# Patient Record
Sex: Female | Born: 1985 | Hispanic: No | Marital: Single | Smoking: Current every day smoker
Health system: Southern US, Community
[De-identification: ages and names within clinical notes are randomized; demographics above are authoritative.]

## PROBLEM LIST (undated history)

## (undated) DIAGNOSIS — N3281 Overactive bladder: Secondary | ICD-10-CM

## (undated) DIAGNOSIS — D219 Benign neoplasm of connective and other soft tissue, unspecified: Secondary | ICD-10-CM

## (undated) DIAGNOSIS — N390 Urinary tract infection, site not specified: Secondary | ICD-10-CM

## (undated) HISTORY — DX: Overactive bladder: N32.81

## (undated) HISTORY — DX: Urinary tract infection, site not specified: N39.0

## (undated) HISTORY — DX: Benign neoplasm of connective and other soft tissue, unspecified: D21.9

---

## 2008-09-19 DIAGNOSIS — N390 Urinary tract infection, site not specified: Secondary | ICD-10-CM

## 2008-09-19 HISTORY — DX: Urinary tract infection, site not specified: N39.0

## 2009-05-28 ENCOUNTER — Ambulatory Visit: Payer: Self-pay | Admitting: Internal Medicine

## 2009-05-28 DIAGNOSIS — F17200 Nicotine dependence, unspecified, uncomplicated: Secondary | ICD-10-CM | POA: Insufficient documentation

## 2009-05-28 DIAGNOSIS — R3915 Urgency of urination: Secondary | ICD-10-CM | POA: Insufficient documentation

## 2009-05-28 DIAGNOSIS — N390 Urinary tract infection, site not specified: Secondary | ICD-10-CM

## 2009-05-29 LAB — CONVERTED CEMR LAB
ALT: 18 units/L (ref 0–35)
AST: 19 units/L (ref 0–37)
Albumin: 4.5 g/dL (ref 3.5–5.2)
Alkaline Phosphatase: 33 units/L — ABNORMAL LOW (ref 39–117)
BUN: 17 mg/dL (ref 6–23)
Basophils Absolute: 0 10*3/uL (ref 0.0–0.1)
Basophils Relative: 0.1 % (ref 0.0–3.0)
Bilirubin Urine: NEGATIVE
Bilirubin, Direct: 0.1 mg/dL (ref 0.0–0.3)
CO2: 24 meq/L (ref 19–32)
Calcium: 9.1 mg/dL (ref 8.4–10.5)
Chloride: 103 meq/L (ref 96–112)
Creatinine, Ser: 0.9 mg/dL (ref 0.4–1.2)
Eosinophils Absolute: 0.2 10*3/uL (ref 0.0–0.7)
Eosinophils Relative: 3.2 % (ref 0.0–5.0)
GFR calc non Af Amer: 80.78 mL/min (ref >60)
Glucose, Bld: 93 mg/dL (ref 70–99)
HCT: 40 % (ref 36.0–46.0)
Hemoglobin: 13.8 g/dL (ref 12.0–15.0)
Ketones, ur: NEGATIVE mg/dL
Leukocytes, UA: NEGATIVE
Lymphocytes Relative: 42.8 % (ref 12.0–46.0)
Lymphs Abs: 2.1 10*3/uL (ref 0.7–4.0)
MCHC: 34.5 g/dL (ref 30.0–36.0)
MCV: 92 fL (ref 78.0–100.0)
Monocytes Absolute: 0.3 10*3/uL (ref 0.1–1.0)
Monocytes Relative: 6.7 % (ref 3.0–12.0)
Neutro Abs: 2.3 10*3/uL (ref 1.4–7.7)
Neutrophils Relative %: 47.2 % (ref 43.0–77.0)
Nitrite: NEGATIVE
Platelets: 205 10*3/uL (ref 150.0–400.0)
Potassium: 4.4 meq/L (ref 3.5–5.1)
RBC: 4.35 M/uL (ref 3.87–5.11)
RDW: 12.1 % (ref 11.5–14.6)
Sed Rate: 10 mm/hr (ref 0–22)
Sodium: 135 meq/L (ref 135–145)
Specific Gravity, Urine: 1.025 (ref 1.000–1.030)
TSH: 1.23 microintl units/mL (ref 0.35–5.50)
Total Bilirubin: 0.9 mg/dL (ref 0.3–1.2)
Total Protein, Urine: NEGATIVE mg/dL
Total Protein: 8.1 g/dL (ref 6.0–8.3)
Urine Glucose: NEGATIVE mg/dL
Urobilinogen, UA: 0.2 (ref 0.0–1.0)
Vit D, 25-Hydroxy: 26 ng/mL — ABNORMAL LOW (ref 30–89)
Vitamin B-12: 339 pg/mL (ref 211–911)
WBC: 4.9 10*3/uL (ref 4.5–10.5)
pH: 6 (ref 5.0–8.0)

## 2009-07-14 ENCOUNTER — Telehealth: Payer: Self-pay | Admitting: Internal Medicine

## 2009-07-16 ENCOUNTER — Ambulatory Visit: Payer: Self-pay | Admitting: Internal Medicine

## 2009-07-17 ENCOUNTER — Encounter: Payer: Self-pay | Admitting: Internal Medicine

## 2009-07-17 LAB — CONVERTED CEMR LAB
Bilirubin Urine: NEGATIVE
Ketones, ur: NEGATIVE mg/dL
Leukocytes, UA: NEGATIVE
Nitrite: NEGATIVE
Specific Gravity, Urine: 1.025 (ref 1.000–1.030)
Total Protein, Urine: NEGATIVE mg/dL
Urine Glucose: NEGATIVE mg/dL
Urobilinogen, UA: 0.2 (ref 0.0–1.0)
pH: 5.5 (ref 5.0–8.0)

## 2009-09-19 DIAGNOSIS — D219 Benign neoplasm of connective and other soft tissue, unspecified: Secondary | ICD-10-CM

## 2009-09-19 DIAGNOSIS — N3281 Overactive bladder: Secondary | ICD-10-CM

## 2009-09-19 HISTORY — DX: Overactive bladder: N32.81

## 2009-09-19 HISTORY — DX: Benign neoplasm of connective and other soft tissue, unspecified: D21.9

## 2009-09-19 LAB — HM PAP SMEAR

## 2009-09-28 ENCOUNTER — Ambulatory Visit: Payer: Self-pay | Admitting: Internal Medicine

## 2009-09-28 LAB — CONVERTED CEMR LAB
Bilirubin Urine: NEGATIVE
Hemoglobin, Urine: NEGATIVE
Ketones, ur: NEGATIVE mg/dL
Nitrite: NEGATIVE
Specific Gravity, Urine: <=1.005 (ref 1.000–1.030)
Total Protein, Urine: NEGATIVE mg/dL
Urine Glucose: NEGATIVE mg/dL
Urobilinogen, UA: 0.2 (ref 0.0–1.0)
pH: 7 (ref 5.0–8.0)

## 2010-03-24 ENCOUNTER — Ambulatory Visit: Payer: Self-pay | Admitting: Internal Medicine

## 2010-03-24 DIAGNOSIS — R1032 Left lower quadrant pain: Secondary | ICD-10-CM

## 2010-03-24 DIAGNOSIS — D259 Leiomyoma of uterus, unspecified: Secondary | ICD-10-CM | POA: Insufficient documentation

## 2010-04-19 LAB — CONVERTED CEMR LAB: Pap Smear: NORMAL

## 2010-05-19 ENCOUNTER — Telehealth: Payer: Self-pay | Admitting: Internal Medicine

## 2010-05-19 ENCOUNTER — Ambulatory Visit: Payer: Self-pay | Admitting: Internal Medicine

## 2010-05-19 DIAGNOSIS — J069 Acute upper respiratory infection, unspecified: Secondary | ICD-10-CM

## 2010-06-21 ENCOUNTER — Encounter: Payer: Self-pay | Admitting: Internal Medicine

## 2010-06-25 ENCOUNTER — Telehealth: Payer: Self-pay | Admitting: Internal Medicine

## 2010-07-02 ENCOUNTER — Ambulatory Visit: Payer: Self-pay | Admitting: Internal Medicine

## 2010-09-29 ENCOUNTER — Other Ambulatory Visit: Payer: Self-pay | Admitting: Internal Medicine

## 2010-09-29 ENCOUNTER — Ambulatory Visit
Admission: RE | Admit: 2010-09-29 | Discharge: 2010-09-29 | Payer: Self-pay | Source: Home / Self Care | Attending: Internal Medicine | Admitting: Internal Medicine

## 2010-09-29 LAB — URINALYSIS
Bilirubin Urine: NEGATIVE
Hemoglobin, Urine: NEGATIVE
Ketones, ur: NEGATIVE
Leukocytes, UA: NEGATIVE
Nitrite: NEGATIVE
Specific Gravity, Urine: 1.025 (ref 1.000–1.030)
Total Protein, Urine: NEGATIVE
Urine Glucose: NEGATIVE
Urobilinogen, UA: 0.2 (ref 0.0–1.0)
pH: 6.5 (ref 5.0–8.0)

## 2010-09-29 LAB — CBC WITH DIFFERENTIAL/PLATELET
Basophils Absolute: 0 10*3/uL (ref 0.0–0.1)
Basophils Relative: 0.4 % (ref 0.0–3.0)
Eosinophils Absolute: 0.3 10*3/uL (ref 0.0–0.7)
Eosinophils Relative: 3.2 % (ref 0.0–5.0)
HCT: 40 % (ref 36.0–46.0)
Hemoglobin: 13.7 g/dL (ref 12.0–15.0)
Lymphocytes Relative: 41.4 % (ref 12.0–46.0)
Lymphs Abs: 3.6 10*3/uL (ref 0.7–4.0)
MCHC: 34.3 g/dL (ref 30.0–36.0)
MCV: 91.3 fl (ref 78.0–100.0)
Monocytes Absolute: 0.7 10*3/uL (ref 0.1–1.0)
Monocytes Relative: 7.9 % (ref 3.0–12.0)
Neutro Abs: 4.1 10*3/uL (ref 1.4–7.7)
Neutrophils Relative %: 47.1 % (ref 43.0–77.0)
Platelets: 233 10*3/uL (ref 150.0–400.0)
RBC: 4.38 Mil/uL (ref 3.87–5.11)
RDW: 13.9 % (ref 11.5–14.6)
WBC: 8.6 10*3/uL (ref 4.5–10.5)

## 2010-09-29 LAB — LIPID PANEL
Cholesterol: 184 mg/dL (ref 0–200)
HDL: 42.9 mg/dL (ref >39.00)
LDL Cholesterol: 116 mg/dL — ABNORMAL HIGH (ref 0–99)
Total CHOL/HDL Ratio: 4
Triglycerides: 124 mg/dL (ref 0.0–149.0)
VLDL: 24.8 mg/dL (ref 0.0–40.0)

## 2010-09-29 LAB — BASIC METABOLIC PANEL
BUN: 10 mg/dL (ref 6–23)
CO2: 24 mEq/L (ref 19–32)
Calcium: 9 mg/dL (ref 8.4–10.5)
Chloride: 107 mEq/L (ref 96–112)
Creatinine, Ser: 0.6 mg/dL (ref 0.4–1.2)
GFR: 138.22 mL/min (ref >60.00)
Glucose, Bld: 92 mg/dL (ref 70–99)
Potassium: 4.6 mEq/L (ref 3.5–5.1)
Sodium: 139 mEq/L (ref 135–145)

## 2010-09-29 LAB — HEPATIC FUNCTION PANEL
ALT: 27 U/L (ref 0–35)
AST: 23 U/L (ref 0–37)
Albumin: 4 g/dL (ref 3.5–5.2)
Alkaline Phosphatase: 37 U/L — ABNORMAL LOW (ref 39–117)
Bilirubin, Direct: 0.1 mg/dL (ref 0.0–0.3)
Total Bilirubin: 0.5 mg/dL (ref 0.3–1.2)
Total Protein: 7.2 g/dL (ref 6.0–8.3)

## 2010-09-29 LAB — TSH: TSH: 3.76 u[IU]/mL (ref 0.35–5.50)

## 2010-10-19 NOTE — Assessment & Plan Note (Signed)
Summary: 2 MTH FU  #---STC rs'd /  cd   Vital Signs:  Patient profile:   25 year old female Weight:      117 pounds Temp:     97.6 degrees F oral Pulse rate:   98 / minute BP sitting:   112 / 84  (left arm)  Vitals Entered By: Tora Perches (September 28, 2009 10:11 AM) CC: f/u Is Patient Diabetic? No   CC:  f/u.  History of Present Illness: F/u chronic cystitis symptoms -much  better on current Rx. Vesicare is expensive...  Preventive Screening-Counseling & Management  Alcohol-Tobacco     Smoking Status: current  Current Medications (verified): 1)  Sulfamethoxazole-Tmp Ds 800-160 Mg Tabs (Sulfamethoxazole-Trimethoprim) .... Once Daily 2)  Vitamin D3 1000 Unit  Tabs (Cholecalciferol) .Marland Kitchen.. 1 By Mouth Daily 3)  Vesicare 5 Mg Tabs (Solifenacin Succinate) .Marland Kitchen.. 1 By Mouth Daily 4)  Depo-Provera 150 Mg/ml Susp (Medroxyprogesterone Acetate) .Marland Kitchen.. 1 Q 3 Mo 5)  Phenazopyridine Hcl 200 Mg Tabs (Phenazopyridine Hcl) .Marland Kitchen.. 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    Physical Exam  General:  Well-developed,well-nourished,in no acute distress; alert,appropriate and cooperative throughout examination Mouth:  Oral mucosa and oropharynx without lesions or exudates.  Teeth in good repair. Lungs:  Normal respiratory effort, chest expands symmetrically. Lungs are clear to auscultation, no crackles or wheezes. Heart:  Normal rate and regular rhythm. S1 and S2 normal without gallop, murmur, click, rub or other extra sounds. Abdomen:  Bowel sounds positive,abdomen soft and non-tender without  masses, organomegaly or hernias noted. Msk:  No deformity or scoliosis noted of thoracic or lumbar spine.   Neurologic:  No cranial nerve deficits noted. Station and gait are normal. Plantar reflexes are down-going bilaterally. DTRs are symmetrical throughout. Sensory, motor and coordinative functions appear intact. Skin:  Intact without suspicious lesions or rashes Psych:  Cognition and judgment appear intact. Alert and cooperative with normal attention span and concentration. No apparent delusions, illusions, hallucinations   Impression & Recommendations:  Problem # 1:  UTI'S, CHRONIC (ICD-599.0) Assessment Improved  Her updated medication list for this problem includes:    Sulfamethoxazole-tmp Ds 800-160 Mg Tabs (Sulfamethoxazole-trimethoprim) .Marland Kitchen... 1 once daily - try 3 times a wk (and qd after intercourse)    Vesicare 5 Mg Tabs (Solifenacin succinate) .Marland Kitchen... 1 by mouth daily or    Detrol La 4 Mg Xr24h-cap (Tolterodine tartrate) .Marland Kitchen... 1 by mouth once daily if less $$  Orders: TLB-Udip ONLY (81003-UDIP)  Problem # 2:  URINARY URGENCY, CHRONIC (XBJ-478.29) Assessment: Improved  Complete Medication List: 1)  Sulfamethoxazole-tmp Ds 800-160 Mg Tabs (Sulfamethoxazole-trimethoprim) .Marland Kitchen.. 1 once daily 2)  Vitamin D3 1000 Unit Tabs (Cholecalciferol) .Marland Kitchen.. 1 by mouth daily 3)  Vesicare 5 Mg Tabs (Solifenacin succinate) .Marland Kitchen.. 1 by mouth daily 4)  Depo-provera 150 Mg/ml Susp (Medroxyprogesterone acetate) .Marland Kitchen.. 1 q 3 mo 5)  Phenazopyridine Hcl 200 Mg Tabs (Phenazopyridine hcl) .Marland Kitchen.. 1 by mouth two times a day as needed burning 6)  Detrol La 4 Mg Xr24h-cap (Tolterodine tartrate) .Marland Kitchen.. 1 by mouth qd  Patient Instructions: 1)  Please schedule a follow-up appointment in 6  months. 2)  Call if you are not better in a reasonable amount of time or if worse.  Prescriptions: DETROL LA 4 MG XR24H-CAP (TOLTERODINE TARTRATE) 1 by mouth qd  #90 x 3   Entered and Authorized by:   Tresa Garter MD    Signed by:   Tresa Garter MD on 09/28/2009   Method used:   Print then Give to Patient   RxID:   [id number] SULFAMETHOXAZOLE-TMP DS 800-160 MG TABS (SULFAMETHOXAZOLE-TRIMETHOPRIM) 1 once daily  #90 x 1   Entered and Authorized by:   Tresa Garter MD   Signed by:   Tresa Garter MD on 09/28/2009   Method used:   Electronically to        [facility]* (retail)       [facility] [place], [place]  [address]       Ph: [phone]       Fax: [phone]   RxID:   [id number]

## 2010-10-19 NOTE — Progress Notes (Signed)
Summary: OV TODAY  Phone Note Call from Patient Call back at Home Phone [phone]   Summary of Call: Patient is requesting a call.  Initial call taken by: Lamar Sprinkles, CMA,  May 19, 2010 11:02 AM  Follow-up for Phone Call        left mess to call office back.............Marland KitchenLamar Sprinkles, CMA  May 19, 2010 11:06 AM   Pt is being treated for chronic uti. Recently she had to stop antibiotic for uti and changed to another antibiotic for URI. This new med is from [place] and she would like to disucss with MD b/c this one makes her feel better. Also concerned about recurrent urinary problems. Per pt she has had testing thru urologist w/no problems found. Pt scheduled for office visit today.  Follow-up by: Lamar Sprinkles, CMA,  May 19, 2010 11:13 AM  Additional Follow-up for Phone Call Additional follow up Details #1::        ok Additional Follow-up by: Tresa Garter MD,  May 19, 2010 1:00 PM

## 2010-10-19 NOTE — Letter (Signed)
Summary: [facility]  [facility]   Imported By: Lennie Odor 07/08/2010 11:14:32  _____________________________________________________________________  External Attachment:    Type:   Image     Comment:   External Document

## 2010-10-19 NOTE — Letter (Signed)
Summary: [facility]   Imported By: Sherian Rein 06/29/2010 11:43:03  _____________________________________________________________________  External Attachment:    Type:   Image     Comment:   External Document

## 2010-10-19 NOTE — Assessment & Plan Note (Signed)
Summary: 6 mos f/u//#/cd   Vital Signs:  Patient profile:   25 year old female Height:      62 inches (157.48 cm) Weight:      117 pounds (53.18 kg) BMI:     21.48 O2 Sat:      98 % on Room air Temp:     98.4 degrees F (36.89 degrees C) oral Pulse rate:   108 / minute Pulse rhythm:   regular Resp:     16 per minute BP sitting:   118 / 70  (left arm) Cuff size:   regular  Vitals Entered By: Lanier Prude, CMA(AAMA) (March 24, 2010 10:14 AM)  O2 Flow:  Room air CC: 6 mo f/u Comments was told by [facility] she has uterine fibroids   CC:  6 mo f/u.  History of Present Illness: F/u urgency and frequency  - taking 5 mg Vesicare. She was able to reduce her antibiotic use. Overall - much better. She had bad abd pain - had a CT and [place] and was dx'd w/fibroids.  Current Medications (verified): 1)  Sulfamethoxazole-Tmp Ds 800-160 Mg Tabs (Sulfamethoxazole-Trimethoprim) .Marland Kitchen.. 1 Once Daily 2)  Vitamin D3 1000 Unit  Tabs (Cholecalciferol) .Marland Kitchen.. 1 By Mouth Daily 3)  Vesicare 5 Mg Tabs (Solifenacin Succinate) .Marland Kitchen.. 1 By Mouth Daily 4)  Depo-Provera 150 Mg/ml Susp (Medroxyprogesterone Acetate) .Marland Kitchen.. 1 Q 3 Mo 5)  Phenazopyridine Hcl 200 Mg Tabs (Phenazopyridine Hcl) .Marland Kitchen.. 1 By Mouth Two Times A Day As Needed Burning 6)  Detrol La 4 Mg Xr24h-Cap (Tolterodine Tartrate) .Marland Kitchen.. 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    Physical Exam  General:  Well-developed,well-nourished,in no acute distress; alert,appropriate and cooperative throughout  examination Nose:  External nasal examination shows no deformity or inflammation. Nasal mucosa are pink and moist without lesions or exudates. Mouth:  Oral mucosa and oropharynx without lesions or exudates.  Teeth in good repair. Neck:  No deformities, masses, or tenderness noted. Lungs:  Normal respiratory effort, chest expands symmetrically. Lungs are clear to auscultation, no crackles or wheezes. Heart:  Normal rate and regular rhythm. S1 and S2 normal without gallop, murmur, click, rub or other extra sounds. Abdomen:  Bowel sounds positive,abdomen soft and non-tender without masses, organomegaly or hernias noted. Msk:  No deformity or scoliosis noted of thoracic or lumbar spine.   Neurologic:  No cranial nerve deficits noted. Station and gait are normal. Plantar reflexes are down-going bilaterally. DTRs are symmetrical throughout. Sensory, motor and coordinative functions appear intact. Skin:  Intact without suspicious lesions or rashes Psych:  Cognition and judgment appear intact. Alert and cooperative with normal attention span and concentration. No apparent delusions, illusions, hallucinations   Impression & Recommendations:  Problem # 1:  UTI'S, CHRONIC (ICD-599.0) Assessment Improved  The following medications were removed from the medication list:    Detrol La 4 Mg Xr24h-cap (Tolterodine tartrate) .Marland Kitchen... 1 by mouth qd Her updated medication list for this problem includes:    Sulfamethoxazole-tmp Ds 800-160 Mg Tabs (Sulfamethoxazole-trimethoprim) .Marland Kitchen... 1 once daily    Vesicare 5 Mg Tabs (Solifenacin  succinate) .Marland Kitchen... 1 by mouth daily  Problem # 2:  URINARY URGENCY, CHRONIC (ICD-788.63) Assessment: Improved Cont rx  Problem # 3:  FIBROIDS, UTERUS (ICD-218.9) Assessment: New F/u w/GYN  Problem # 4:  ABDOMINAL PAIN, LEFT LOWER QUADRANT (ICD-789.04) due to #3 Assessment: Improved Resolved  Complete Medication List: 1)  Sulfamethoxazole-tmp Ds 800-160 Mg Tabs  (Sulfamethoxazole-trimethoprim) .Marland Kitchen.. 1 once daily 2)  Vitamin D3 1000 Unit Tabs (Cholecalciferol) .Marland Kitchen.. 1 by mouth daily 3)  Vesicare 5 Mg Tabs (Solifenacin succinate) .Marland Kitchen.. 1 by mouth daily 4)  Depo-provera 150 Mg/ml Susp (Medroxyprogesterone acetate) .Marland Kitchen.. 1 q 3 mo 5)  Phenazopyridine Hcl 200 Mg Tabs (Phenazopyridine hcl) .Marland Kitchen.. 1 by mouth two times a day as needed burning  Patient Instructions: 1)  Please schedule a follow-up appointment in 6 months. Prescriptions: VESICARE 5 MG TABS (SOLIFENACIN SUCCINATE) 1 by mouth daily  #30 x 6   Entered and Authorized by:   Tresa Garter MD   Signed by:   Tresa Garter MD on 03/24/2010   Method used:   Print then Give to Patient   RxID:   [id number] SULFAMETHOXAZOLE-TMP DS 800-160 MG TABS (SULFAMETHOXAZOLE-TRIMETHOPRIM) 1 once daily  #90 x 1   Entered and Authorized by:   Tresa Garter MD   Signed by:   Tresa Garter MD on 03/24/2010   Method used:   Print then Give to Patient   RxID:   [id number]

## 2010-10-19 NOTE — Assessment & Plan Note (Signed)
Summary: discuss recurrent urinary issues/SD   Vital Signs:  Patient profile:   25 year old female Height:      62 inches Weight:      113 pounds BMI:     20.74 Temp:     98.8 degrees F oral Pulse rate:   96 / minute Pulse rhythm:   regular Resp:     16 per minute BP sitting:   110 / 78  (left arm) Cuff size:   regular  Vitals Entered By: Lanier Prude, Beverly Gust) (May 19, 2010 1:35 PM) CC: recurrent urinary infections Is Patient Diabetic? No   CC:  recurrent urinary infections.  History of Present Illness: The patient presents with complaints of sore throat, fever, cough, sinus congestion and drainge of several days duration. Not better with OTC meds. Took Amoxicillin for this cold- bladder sx have gotten  better as well. She is wondering about med switch She had a GYN exam in August and her STD tests were nl.  Current Medications (verified): 1)  Sulfamethoxazole-Tmp Ds 800-160 Mg Tabs (Sulfamethoxazole-Trimethoprim) .Marland Kitchen.. 1 Once Daily 2)  Vitamin D3 1000 Unit  Tabs (Cholecalciferol) .Marland Kitchen.. 1 By Mouth Daily 3)  Vesicare 5 Mg Tabs (Solifenacin Succinate) .Marland Kitchen.. 1 By Mouth Daily 4)  Depo-Provera 150 Mg/ml Susp (Medroxyprogesterone Acetate) .Marland Kitchen.. 1 Q 3 Mo 5)  Phenazopyridine Hcl 200 Mg Tabs (Phenazopyridine Hcl) .Marland Kitchen.. 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    Physical Exam  General:  Well-developed,well-nourished,in  no acute distress; alert,appropriate and cooperative throughout examination Eyes:  No corneal or conjunctival inflammation noted. EOMI. Perrla. Mouth:  Oral mucosa and oropharynx without lesions or exudates.  Teeth in good repair. Neck:  No deformities, masses, or tenderness noted. Lungs:  Normal respiratory effort, chest expands symmetrically. Lungs are clear to auscultation, no crackles or wheezes. Heart:  Normal rate and regular rhythm. S1 and S2 normal without gallop, murmur, click, rub or other extra sounds. Abdomen:  Bowel sounds positive,abdomen soft and non-tender without masses, organomegaly or hernias noted. Msk:  No deformity or scoliosis noted of thoracic or lumbar spine.   Neurologic:  No cranial nerve deficits noted. Station and gait are normal. Plantar reflexes are down-going bilaterally. DTRs are symmetrical throughout. Sensory, motor and coordinative functions appear intact.   Impression & Recommendations:  Problem # 1:  URINARY URGENCY, CHRONIC (ICD-788.63) Assessment Unchanged Change abx to Amoxicillin Orders: Urology Referral (Urology) at [facility] We can try empiric Acyclovir  Problem # 2:  UPPER RESPIRATORY INFECTION, ACUTE (ICD-465.9) Assessment: New Better Use over-the-counter medicines for "cold": Tylenol  650mg  or Advil 400mg  every 6 hours  for fever; Delsym or Robutussin for cough. Mucinex or Mucinex D for congestion. Ricola or Halls for sore throat. Office visit if not better or if worse.   Complete Medication List: 1)  Vitamin D3 1000 Unit Tabs (Cholecalciferol) .Marland Kitchen.. 1 by mouth daily 2)  Vesicare 5 Mg Tabs (Solifenacin succinate) .Marland Kitchen.. 1 by mouth daily 3)  Depo-provera 150 Mg/ml Susp (Medroxyprogesterone acetate) .Marland Kitchen.. 1 q  3 mo 4)  Phenazopyridine Hcl 200 Mg Tabs (Phenazopyridine hcl) .Marland Kitchen.. 1 by mouth two times a day as needed burning 5)  Amoxicillin 500 Mg Caps (Amoxicillin) .Marland Kitchen.. 1 by mouth qd 6)  Acyclovir 400 Mg Tabs (Acyclovir) .Marland Kitchen.. 1 by mouth three times a  day as needed  x 7 d prn  Patient Instructions: 1)  Please schedule a follow-up appointment in 6 months. 2)  Call if you are not better in a reasonable amount of time or if worse.  Prescriptions: ACYCLOVIR 400 MG TABS (ACYCLOVIR) 1 by mouth three times a day as needed  x 7 d prn  #63 x 3   Entered and Authorized by:   Tresa Garter MD   Signed by:   Tresa Garter MD on 05/19/2010   Method used:   Print then Give to Patient   RxID:   [id number] AMOXICILLIN 500 MG CAPS (AMOXICILLIN) 1 by mouth qd  #90 x 3   Entered and Authorized by:   Tresa Garter MD   Signed by:   Tresa Garter MD on 05/19/2010   Method used:   Print then Give to Patient   RxID:   [id number]

## 2010-10-19 NOTE — Progress Notes (Signed)
Summary: Change in RX for UTI  Phone Note From Other Clinic   Summary of Call: Dr Debby Bud took call from lab - urine culture, done at [facility], was resistent to cipro. Change to macrodantin 100mg  per MD Initial call taken by: Lamar Sprinkles, CMA,  June 25, 2010 5:09 PM  Follow-up for Phone Call        Pt informed of change in medication. She would like an order in the lab for u/a recheck after antibiotic are completed. Ok for this? If yes, would you like culture too if postive?   Follow-up by: Lamar Sprinkles, CMA,  June 25, 2010 6:08 PM  Additional Follow-up for Phone Call Additional follow up Details #1::        OK UA and cx in 2 wks 595.0 Additional Follow-up by: Tresa Garter MD,  June 28, 2010 12:50 PM    New/Updated Medications: MACRODANTIN 100 MG CAPS (NITROFURANTOIN MACROCRYSTAL) 1 two times a day x 7 days Prescriptions: MACRODANTIN 100 MG CAPS (NITROFURANTOIN MACROCRYSTAL) 1 two times a day x 7 days  #14 x 0   Entered by:   Lamar Sprinkles, CMA   Authorized by:   Jacques Navy MD   Signed by:   Lamar Sprinkles, CMA on 06/25/2010   Method used:   Electronically to        [facility]* (retail)       [facility] [place], [place]  [address]       Ph: [phone]       Fax: [phone]   RxID:   [id number]

## 2010-10-19 NOTE — Progress Notes (Signed)
Summary: UTI  Phone Note Call from Patient Call back at Home Phone [phone]   Summary of Call: Pt c/o urinary burning since monday and this afternoon has visible blood in her urine. Ok Cipro per MD. Pt informed and will call w/any change in symptoms.  Initial call taken by: Lamar Sprinkles, CMA,  June 25, 2010 2:21 PM  Follow-up for Phone Call        agree Thank you!  Follow-up by: Tresa Garter MD,  June 25, 2010 4:42 PM    New/Updated Medications: CIPRO 250 MG TABS (CIPROFLOXACIN HCL) 1 two times a day x 7 days Prescriptions: CIPRO 250 MG TABS (CIPROFLOXACIN HCL) 1 two times a day x 7 days  #14 x 0   Entered by:   Lamar Sprinkles, CMA   Authorized by:   Tresa Garter MD   Signed by:   Lamar Sprinkles, CMA on 06/25/2010   Method used:   Electronically to        [facility]* (retail)       [facility] [place], [place]  [address]       Ph: [phone]       Fax: [phone]   RxID:   [id number]

## 2010-10-21 NOTE — Assessment & Plan Note (Signed)
Summary: cpx / #/cd   Vital Signs:  Patient profile:   25 year old female Height:      62 inches Weight:      117 pounds BMI:     21.48 Temp:     98.7 degrees F oral Pulse rate:   92 / minute Pulse rhythm:   regular Resp:     16 per minute BP sitting:   112 / 80  (left arm) Cuff size:   regular  Vitals Entered By: Lanier Prude, CMA(AAMA) (September 29, 2010 9:28 AM) CC: CPX Is Patient Diabetic? No   CC:  CPX.  History of Present Illness: The patient presents for a preventive health examination   Current Medications (verified): 1)  Vitamin D3 1000 Unit  Tabs (Cholecalciferol) .Marland Kitchen.. 1 By Mouth Daily 2)  Vesicare 5 Mg Tabs (Solifenacin Succinate) .Marland Kitchen.. 1 By Mouth Daily 3)  Depo-Provera 150 Mg/ml Susp (Medroxyprogesterone Acetate) .Marland Kitchen.. 1 Q 3 Mo 4)  Phenazopyridine Hcl 200 Mg Tabs (Phenazopyridine Hcl) .Marland Kitchen.. 1 By Mouth Two Times A Day As Needed Burning 5)  Acyclovir 400 Mg Tabs (Acyclovir) .Marland Kitchen.. 1 By Mouth Three Times A Day As Needed  X 7 D Prn 6)  Coq10 50 Mg Caps (Coenzyme Q10) .Marland Kitchen.. 1 By Mouth Once Daily 7)  Vitamin E 600 Unit Caps (Vitamin E) .Marland Kitchen.. 1 By Mouth Once Daily 8)  Fish Oil 1000 Mg Caps (Omega-3 Fatty Acids) .Marland Kitchen.. 1 By Mouth Once Daily 9)  Buffered Vitamin C 1000 Mg Caps (Ascorbic Acid Buffered) .Marland Kitchen.. 1 By Mouth Once Daily 10)  Bactrim Ds 800-160 Mg Tabs (Sulfamethoxazole-Trimethoprim) .Marland Kitchen.. 1 By Mouth Once Daily 11)  Uribel 118 Mg Caps (Meth-Hyo-M Bl-Na Phos-Ph Sal) .Marland Kitchen.. 1 By Mouth Three Times A Day 12)  Supplement Greens .Marland Kitchen.. 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 Current Smoker Alcohol use-yes Drug use-no Regular exercise-no  Past Medical History: UTIs 2010 Cipro resistant Overactive bladder Dr Gala Lewandowsky 2011 Pelvic floor dysfuntion Fibroids;  one is 4 cm 2011 GYN [facility] 2011  Past Surgical History: Denies surgical history  Review of Systems  The patient denies anorexia, fever, weight loss, weight gain, vision loss, decreased hearing, hoarseness, chest pain, syncope, dyspnea on exertion, peripheral edema, prolonged cough, headaches, hemoptysis, abdominal pain, melena, hematochezia, severe indigestion/heartburn, hematuria, incontinence, genital sores, muscle weakness, suspicious skin lesions, transient blindness, difficulty walking, depression, unusual weight change, abnormal bleeding, enlarged lymph nodes, angioedema, and breast masses.         urinary symptoms   Physical Exam  General:  Well-developed,well-nourished,in no acute distress; alert,appropriate and cooperative throughout examination Head:  Normocephalic and atraumatic without obvious abnormalities. No apparent alopecia or balding. Eyes:  No corneal or conjunctival inflammation noted. EOMI. Perrla. Ears:  External ear exam shows no significant lesions or deformities.  Otoscopic examination reveals clear canals, tympanic membranes are intact bilaterally without bulging, retraction, inflammation or discharge. Hearing is grossly normal bilaterally. Nose:  External nasal examination shows no deformity or inflammation. Nasal mucosa are pink and moist without lesions or exudates. Mouth:  Oral mucosa and oropharynx without lesions or exudates.  Teeth in good repair. Neck:  No deformities, masses, or tenderness noted. Lungs:  Normal respiratory effort, chest expands symmetrically. Lungs are clear to auscultation, no crackles or wheezes. Heart:  Normal rate and regular  rhythm. S1 and S2 normal without gallop, murmur, click, rub or other extra sounds. Abdomen:  Bowel sounds positive,abdomen soft and non-tender without masses, organomegaly or hernias noted. Msk:  No deformity or scoliosis noted of thoracic or lumbar spine.   Pulses:  R and L  carotid,radial,femoral,dorsalis pedis and posterior tibial pulses are full and equal bilaterally Extremities:  No clubbing, cyanosis, edema, or deformity noted with normal full range of motion of all joints.   Neurologic:  No cranial nerve deficits noted. Station and gait are normal. Plantar reflexes are down-going bilaterally. DTRs are symmetrical throughout. Sensory, motor and coordinative functions appear intact. Skin:  Intact without suspicious lesions or rashes Psych:  Cognition and judgment appear intact. Alert and cooperative with normal attention span and concentration. No apparent delusions, illusions, hallucinations   Impression & Recommendations:  Problem # 1:  HEALTH MAINTENANCE EXAM (ICD-V70.0) Assessment New Health and age related issues were discussed. Available screening tests and vaccinations were discussed as well. Healthy life style including good diet and exercise was discussed.  The labs were reviewed with the patient.  GYN q 12 months   Problem # 2:  UTI'S, CHRONIC (ICD-599.0) Assessment: Improved  The following medications were removed from the medication list:    Ciprofloxacin Hcl 250 Mg Tabs (Ciprofloxacin hcl) .Marland Kitchen... 1 by mouth two times a day for cystitis prn Her updated medication list for this problem includes:    Vesicare 5 Mg Tabs (Solifenacin succinate) .Marland Kitchen... 1 by mouth daily    Bactrim Ds 800-160 Mg Tabs (Sulfamethoxazole-trimethoprim) .Marland Kitchen... 1 by mouth once daily    Uribel 118 Mg Caps (Meth-hyo-m bl-na phos-ph sal) .Marland Kitchen... 1 by mouth three times a day    Nitrofurantoin Macrocrystal 100 Mg Caps (Nitrofurantoin macrocrystal) .Marland Kitchen... 1 by mouth two times a day x 5 d as needed cystitis  Problem # 3:  URINARY URGENCY, CHRONIC (ICD-788.63) Assessment: Improved On the regimen of medicine(s) reflected in the chart    Complete Medication List: 1)  Vitamin D3 1000 Unit Tabs (Cholecalciferol) .Marland Kitchen.. 1 by mouth daily 2)  Vesicare 5 Mg Tabs (Solifenacin succinate) .Marland Kitchen.. 1  by mouth daily 3)  Depo-provera 150 Mg/ml Susp (Medroxyprogesterone acetate) .Marland Kitchen.. 1 q 3 mo 4)  Phenazopyridine Hcl 200 Mg Tabs (Phenazopyridine hcl) .Marland Kitchen.. 1 by mouth two times a day as needed burning 5)  Coq10 50 Mg Caps (Coenzyme q10) .Marland Kitchen.. 1 by mouth once daily 6)  Vitamin E 600 Unit Caps (Vitamin e) .Marland Kitchen.. 1 by mouth once daily 7)  Fish Oil 1000 Mg Caps (Omega-3 fatty acids) .Marland Kitchen.. 1 by mouth once daily 8)  Buffered Vitamin C 1000 Mg Caps (Ascorbic acid buffered) .Marland Kitchen.. 1 by mouth once daily 9)  Bactrim Ds 800-160 Mg Tabs (Sulfamethoxazole-trimethoprim) .Marland Kitchen.. 1 by mouth once daily 10)  Uribel 118 Mg Caps (Meth-hyo-m bl-na phos-ph sal) .Marland Kitchen.. 1 by mouth three times a day 11)  Supplement Greens  .Marland KitchenMarland Kitchen. 1 by mouth once daily 12)  Nitrofurantoin Macrocrystal 100 Mg Caps (Nitrofurantoin macrocrystal) .Marland Kitchen.. 1 by mouth two times a day x 5 d as needed cystitis  Patient Instructions: 1)  Please schedule a follow-up appointment in 4 months. 2)  Urine-dip prior to visit, ICD-9:595.9 Prescriptions: NITROFURANTOIN MACROCRYSTAL 100 MG CAPS (NITROFURANTOIN MACROCRYSTAL) 1 by mouth two times a day x 5 d as needed cystitis  #30 x 1   Entered and Authorized by:   Tresa Garter MD   Signed by:   Tresa Garter MD on 09/29/2010   Method used:  Electronically to        [facility]* (retail)       [facility] [place], [place]  [address]       Ph: [phone]       Fax: [phone]   RxID:   [id number] CIPROFLOXACIN HCL 250 MG TABS (CIPROFLOXACIN HCL) 1 by mouth two times a day for cystitis prn  #30 x 1   Entered and Authorized by:   Tresa Garter MD   Signed by:   Tresa Garter MD on 09/29/2010   Method used:   Electronically to        [facility]* (retail)       [facility] [place], [place]  [address]       Ph: [phone]       Fax: [phone]   RxID:   [id number]    Orders Added: 1)  Est. Patient  18-39 years [[id number]]    Preventive Care Screening  Pap Smear:    Date:  04/19/2010    Results:  normal     Preventive Care Screening  Pap Smear:    Date:  04/19/2010    Results:  normal

## 2010-11-10 ENCOUNTER — Encounter: Payer: Self-pay | Admitting: Internal Medicine

## 2010-11-25 NOTE — Letter (Signed)
Summary: [facility]  [facility]   Imported By: Lennie Odor 11/19/2010 15:16:19  _____________________________________________________________________  External Attachment:    Type:   Image     Comment:   External Document

## 2011-01-21 ENCOUNTER — Other Ambulatory Visit: Payer: Self-pay | Admitting: Internal Medicine

## 2011-01-21 ENCOUNTER — Other Ambulatory Visit: Payer: Self-pay

## 2011-01-21 DIAGNOSIS — N3090 Cystitis, unspecified without hematuria: Secondary | ICD-10-CM

## 2011-01-26 ENCOUNTER — Encounter: Payer: Self-pay | Admitting: Internal Medicine

## 2011-01-28 ENCOUNTER — Ambulatory Visit (INDEPENDENT_AMBULATORY_CARE_PROVIDER_SITE_OTHER)
Admission: RE | Admit: 2011-01-28 | Discharge: 2011-01-28 | Disposition: A | Discharge status: Home / Self Care | Payer: PRIVATE HEALTH INSURANCE | Source: Ambulatory Visit | Attending: Internal Medicine | Admitting: Internal Medicine

## 2011-01-28 ENCOUNTER — Other Ambulatory Visit (INDEPENDENT_AMBULATORY_CARE_PROVIDER_SITE_OTHER): Payer: PRIVATE HEALTH INSURANCE

## 2011-01-28 ENCOUNTER — Ambulatory Visit (INDEPENDENT_AMBULATORY_CARE_PROVIDER_SITE_OTHER): Payer: PRIVATE HEALTH INSURANCE | Admitting: Internal Medicine

## 2011-01-28 ENCOUNTER — Encounter: Payer: Self-pay | Admitting: Internal Medicine

## 2011-01-28 DIAGNOSIS — R61 Generalized hyperhidrosis: Secondary | ICD-10-CM

## 2011-01-28 DIAGNOSIS — IMO0001 Reserved for inherently not codable concepts without codable children: Secondary | ICD-10-CM | POA: Insufficient documentation

## 2011-01-28 DIAGNOSIS — R634 Abnormal weight loss: Secondary | ICD-10-CM

## 2011-01-28 DIAGNOSIS — N3090 Cystitis, unspecified without hematuria: Secondary | ICD-10-CM

## 2011-01-28 DIAGNOSIS — N390 Urinary tract infection, site not specified: Secondary | ICD-10-CM

## 2011-01-28 DIAGNOSIS — R3915 Urgency of urination: Secondary | ICD-10-CM

## 2011-01-28 LAB — CBC WITH DIFFERENTIAL/PLATELET
Basophils Relative: 0.7 % (ref 0.0–3.0)
Eosinophils Relative: 3.1 % (ref 0.0–5.0)
Hemoglobin: 14.2 g/dL (ref 12.0–15.0)
Lymphocytes Relative: 36.6 % (ref 12.0–46.0)
MCHC: 34.5 g/dL (ref 30.0–36.0)
Monocytes Relative: 6.7 % (ref 3.0–12.0)
Neutro Abs: 3.4 10*3/uL (ref 1.4–7.7)
RBC: 4.51 Mil/uL (ref 3.87–5.11)

## 2011-01-28 LAB — URINALYSIS
Bilirubin Urine: NEGATIVE
Hgb urine dipstick: NEGATIVE
Total Protein, Urine: NEGATIVE
Urine Glucose: NEGATIVE

## 2011-01-28 LAB — COMPREHENSIVE METABOLIC PANEL
AST: 17 U/L (ref 0–37)
BUN: 11 mg/dL (ref 6–23)
Calcium: 9.3 mg/dL (ref 8.4–10.5)
Chloride: 105 mEq/L (ref 96–112)
Creatinine, Ser: 0.7 mg/dL (ref 0.4–1.2)
GFR: 112.1 mL/min (ref >60.00)

## 2011-01-28 MED ORDER — ALUMINUM CHLORIDE 20 % EX SOLN: Freq: Every day | CUTANEOUS | Status: DC

## 2011-01-28 NOTE — Assessment & Plan Note (Signed)
Better off meds.

## 2011-01-28 NOTE — Progress Notes (Signed)
  Subjective:    Patient ID: Kathleen Beard, female    DOB: [birth year], 25 y.o.   MRN: [id number]  HPI  F/u irritable bladder. She stopped taking all meds. C/o sweats. Stopped Depo in Oct. She lost wt PPD neg q 6 mo at work  Review of [employer] Readings from Last 3 Encounters:  01/28/11 112 lb (50.803 kg)  09/29/10 117 lb (53.071 kg)  05/19/10 113 lb (51.256 kg)       Objective:   Physical Exam  Constitutional: She appears well-developed and well-nourished. No distress.  HENT:  Head: Normocephalic.  Right Ear: External ear normal.  Left Ear: External ear normal.  Nose: Nose normal.  Mouth/Throat: Oropharynx is clear and moist.  Eyes: Conjunctivae are normal. Pupils are equal, round, and reactive to light. Right eye exhibits no discharge. Left eye exhibits no discharge.  Neck: Normal range of motion. Neck supple. No JVD present. No tracheal deviation present. No thyromegaly present.  Cardiovascular: Normal rate, regular rhythm and normal heart sounds.   Pulmonary/Chest: No stridor. No respiratory distress. She has no wheezes.  Abdominal: Soft. Bowel sounds are normal. She exhibits no distension and no mass. There is no tenderness. There is no rebound and no guarding.  Musculoskeletal: She exhibits no edema and no tenderness.  Lymphadenopathy:    She has no cervical adenopathy.  Neurological: She displays normal reflexes. No cranial nerve deficit. She exhibits normal muscle tone. Coordination normal.  Skin: No rash noted. No erythema.  Psychiatric: She has a normal mood and affect. Her behavior is normal. Judgment and thought content normal.          Assessment & Plan:  UTI'S, CHRONIC Better. Not taking any meds  URINARY URGENCY, CHRONIC Better off meds.    Sweats, worse  CXR, labs See meds

## 2011-01-28 NOTE — Assessment & Plan Note (Signed)
Better. Not taking any meds

## 2011-01-28 NOTE — Patient Instructions (Signed)
Call if problems 

## 2011-01-30 ENCOUNTER — Telehealth: Payer: Self-pay | Admitting: Internal Medicine

## 2011-01-30 NOTE — Telephone Encounter (Signed)
Misty Stanley, please, inform patient that all labs and CXR are normal except for mild bronchitis. Do not smoke, pls Thx

## 2011-01-31 NOTE — Telephone Encounter (Signed)
Pt informed

## 2011-05-06 ENCOUNTER — Telehealth: Payer: Self-pay | Admitting: *Deleted

## 2011-05-06 NOTE — Telephone Encounter (Signed)
Rf req for Nirtofurantoin 100 mg 1 bid X 5 days prn. Last filled 4.17.12. Ok to Rf?

## 2011-05-09 ENCOUNTER — Telehealth: Payer: Self-pay | Admitting: *Deleted

## 2011-05-09 NOTE — Telephone Encounter (Signed)
OK to fill this prescription with additional refills x3 Thank you!  

## 2011-05-09 NOTE — Telephone Encounter (Signed)
Error

## 2011-05-10 MED ORDER — NITROFURANTOIN MACROCRYSTAL 100 MG PO CAPS: 100.0000 mg | ORAL_CAPSULE | Freq: Two times a day (BID) | ORAL | Status: DC

## 2011-05-17 ENCOUNTER — Other Ambulatory Visit: Payer: Self-pay | Admitting: *Deleted

## 2011-05-17 MED ORDER — NITROFURANTOIN MONOHYD MACRO 100 MG PO CAPS: 100.0000 mg | ORAL_CAPSULE | Freq: Two times a day (BID) | ORAL | Status: AC

## 2011-08-02 ENCOUNTER — Ambulatory Visit: Payer: PRIVATE HEALTH INSURANCE | Admitting: Internal Medicine

## 2011-09-02 ENCOUNTER — Other Ambulatory Visit: Payer: Self-pay | Admitting: Internal Medicine

## 2011-11-10 ENCOUNTER — Telehealth: Payer: Self-pay | Admitting: *Deleted

## 2011-11-10 MED ORDER — SULFAMETHOXAZOLE-TMP DS 800-160 MG PO TABS: 1.0000 | ORAL_TABLET | Freq: Every day | ORAL | Status: DC

## 2011-11-10 NOTE — Telephone Encounter (Signed)
Rf req for Bactrim DS 1 po qd prn. # 90. Ok to Rf?

## 2011-11-10 NOTE — Telephone Encounter (Signed)
OK to fill this prescription with additional refills x1 Thank you!  

## 2011-11-10 NOTE — Telephone Encounter (Signed)
Rx sent to pharm. Via eRx #90 x 1 refill.

## 2012-06-26 IMAGING — CR DG CHEST 2V
2 series · 2 of 2 positions shown · non-contrast
Comparison: None

CLINICAL DATA: Weight loss, sweats.

CHEST - 2 VIEW

[view not recorded (1 of 2)]
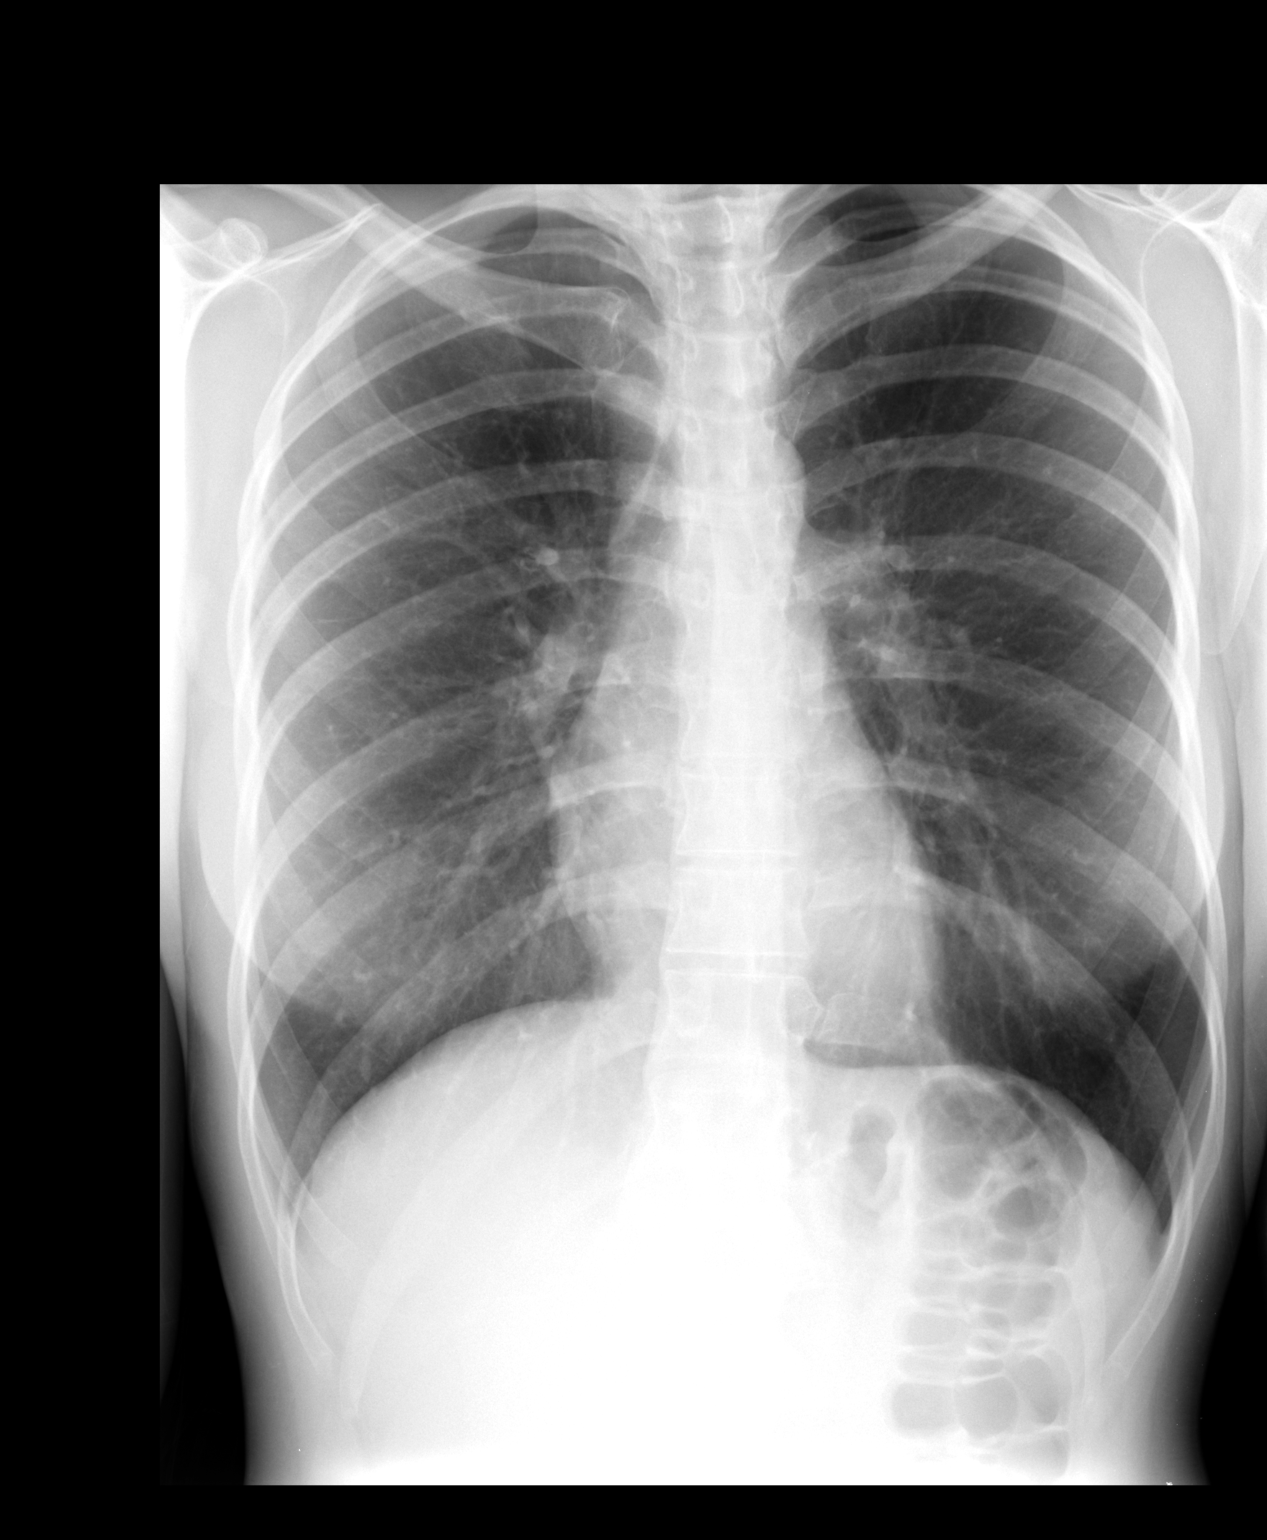

[view not recorded (2 of 2)]
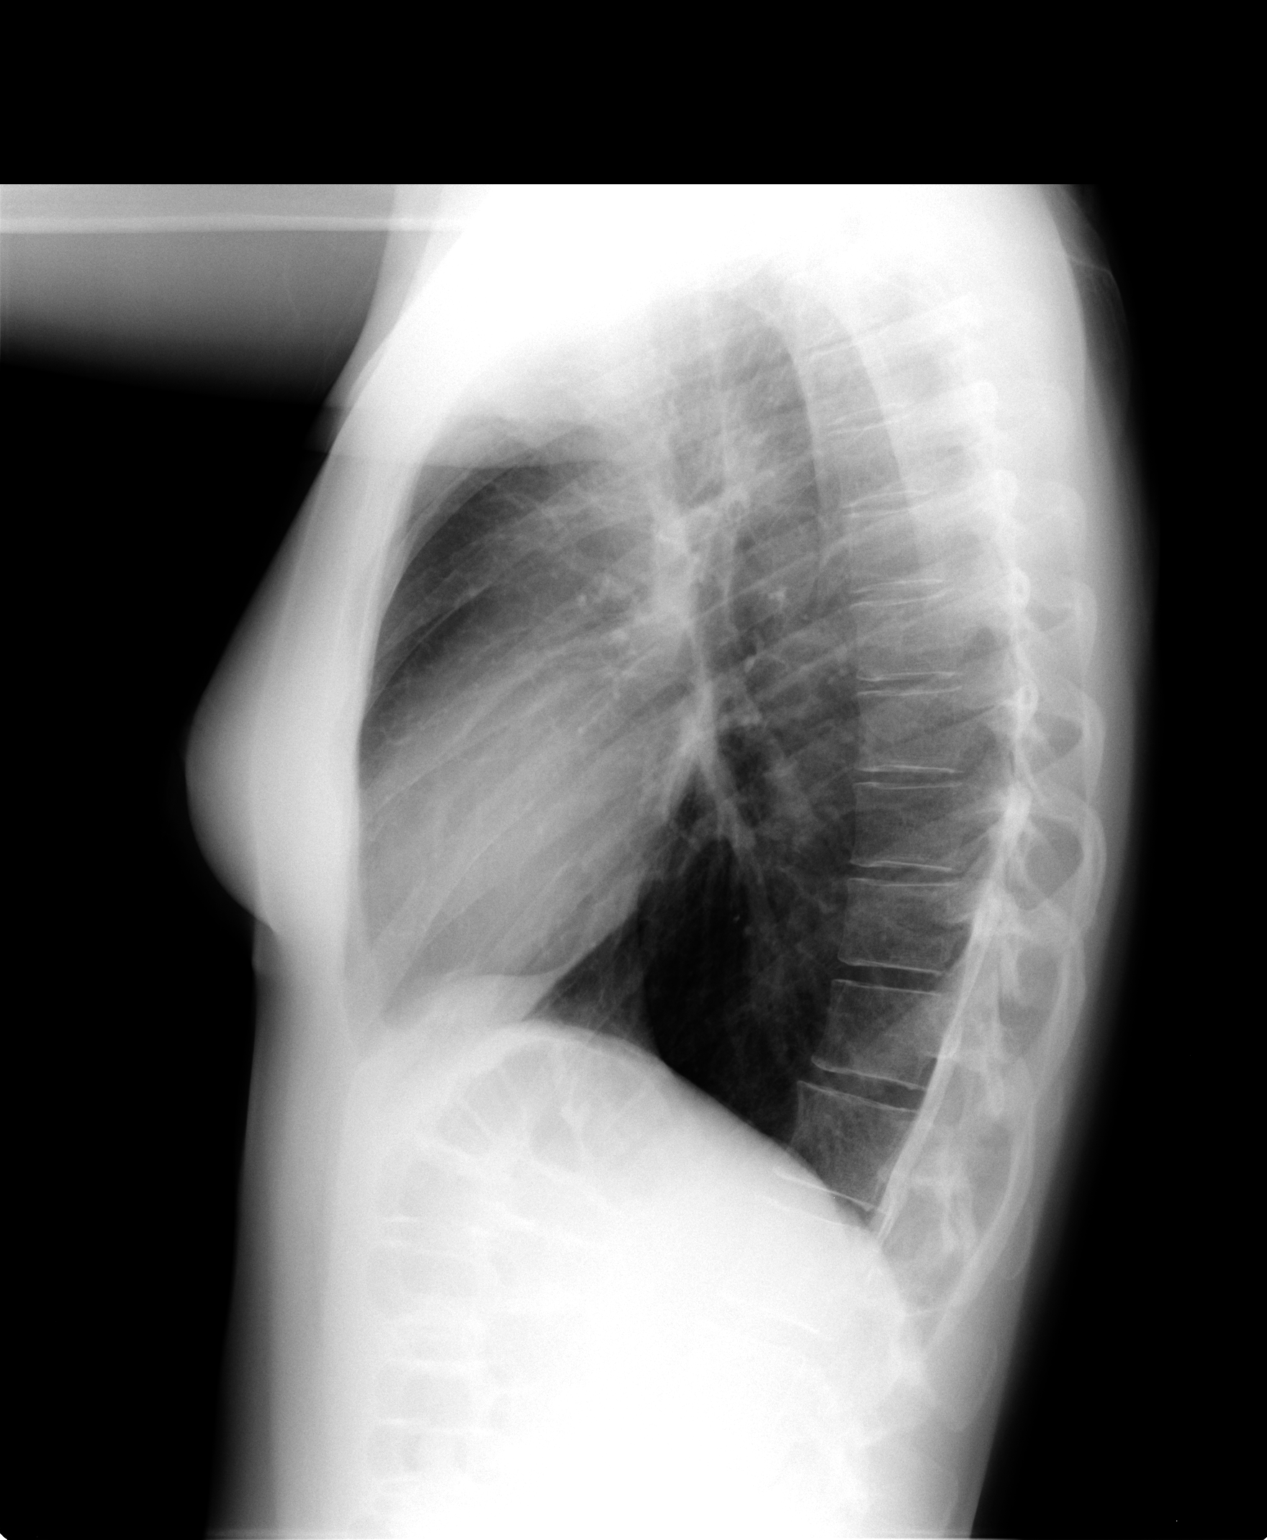

[2 of 2 positions shown; findings below may reference images not displayed]

FINDINGS: Mild hyperinflation of the lungs and peribronchial
thickening. Heart and mediastinal contours are within normal
limits.  No focal opacities or effusions.  No acute bony
abnormality.
IMPRESSION: Mild hyperinflation and bronchitic changes.

## 2014-11-03 ENCOUNTER — Ambulatory Visit (INDEPENDENT_AMBULATORY_CARE_PROVIDER_SITE_OTHER): Payer: BLUE CROSS/BLUE SHIELD | Admitting: Internal Medicine

## 2014-11-03 ENCOUNTER — Telehealth: Payer: Self-pay | Admitting: Internal Medicine

## 2014-11-03 ENCOUNTER — Encounter: Payer: Self-pay | Admitting: Internal Medicine

## 2014-11-03 VITALS — BP 130/78 | HR 107 | Wt 100.0 lb

## 2014-11-03 DIAGNOSIS — Z23 Encounter for immunization: Secondary | ICD-10-CM

## 2014-11-03 DIAGNOSIS — B009 Herpesviral infection, unspecified: Secondary | ICD-10-CM

## 2014-11-03 DIAGNOSIS — R634 Abnormal weight loss: Secondary | ICD-10-CM

## 2014-11-03 DIAGNOSIS — F3110 Bipolar disorder, current episode manic without psychotic features, unspecified: Secondary | ICD-10-CM

## 2014-11-03 DIAGNOSIS — J069 Acute upper respiratory infection, unspecified: Secondary | ICD-10-CM

## 2014-11-03 MED ORDER — VITAMIN D 1000 UNITS PO TABS: 1000.0000 [IU] | ORAL_TABLET | Freq: Every day | ORAL | Status: AC

## 2014-11-03 MED ORDER — ACYCLOVIR 400 MG PO TABS: ORAL_TABLET | ORAL | Status: AC

## 2014-11-03 MED ORDER — AMOXICILLIN 500 MG PO CAPS: 500.0000 mg | ORAL_CAPSULE | Freq: Three times a day (TID) | ORAL | Status: AC

## 2014-11-03 NOTE — Progress Notes (Signed)
Pre visit review using our clinic review tool, if applicable. No additional management support is needed unless otherwise documented below in the visit note. 

## 2014-11-03 NOTE — Assessment & Plan Note (Signed)
Rx Amoxicillin po

## 2014-11-03 NOTE — Telephone Encounter (Signed)
emmi emailed °

## 2014-11-03 NOTE — Assessment & Plan Note (Addendum)
1/16 Dr Posey Pronto ([facility])  Pt was hospitalized x9 days Continue with current prescription therapy as reflected on the Med list.  Records, labs, X rays from [facility] (1/16) reviewed - extensive

## 2014-11-03 NOTE — Assessment & Plan Note (Signed)
Better now 

## 2014-11-03 NOTE — Progress Notes (Signed)
   Subjective:    Patient ID: Kathleen Beard, female    DOB: 04/04/1986, 29 y.o.   MRN: [id number]  HPI  Not seen in >3 years   C/o Vit B12 defficiencies "They locked me into Psych Unit for 9 days, but I talked to them and explained to them that I have a B12 deficiency. My son Kathleen Beard is B12 deficient as wel"l. 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 Negative for chills, activity change, appetite change and fatigue.  HENT: Negative for congestion, mouth sores and sinus pressure.   Eyes: Negative for visual disturbance.  Respiratory: Negative for cough and chest tightness.   Gastrointestinal: Negative for nausea and abdominal pain.  Genitourinary: Negative for frequency, difficulty urinating and vaginal pain.  Musculoskeletal: Negative for back pain and gait problem.  Skin: Negative for pallor and rash.  Neurological: Negative for dizziness, tremors, weakness, numbness and headaches.  Psychiatric/Behavioral: Positive for behavioral problems, dysphoric mood, decreased concentration and agitation. Negative for suicidal ideas, hallucinations, confusion, sleep disturbance and self-injury. The patient is not nervous/anxious and is not hyperactive.     Wt Readings from Last 3 Encounters:  11/03/14 100 lb (45.36 kg)  01/28/11 112 lb (50.803 kg)  05/19/10 113 lb (51.256 kg)   BP Readings from Last 3 Encounters:  11/03/14 130/78  01/28/11 102/80  05/19/10 110/78       Objective:   Physical Exam  Constitutional: She appears well-developed. No distress.  HENT:  Head: Normocephalic.  Right Ear: External ear normal.  Left Ear:  External ear normal.  Nose: Nose normal.  Mouth/Throat: Oropharynx is clear and moist.  Eyes: Conjunctivae are normal. Pupils are equal, round, and reactive to light. Right eye exhibits no discharge. Left eye exhibits no discharge.  Neck: Normal range of motion. Neck supple. No JVD present. No tracheal deviation present. No thyromegaly present.  Cardiovascular: Normal rate, regular rhythm and normal heart sounds.   Pulmonary/Chest: No stridor. No respiratory distress. She has no wheezes.  Abdominal: Soft. Bowel sounds are normal. She exhibits no distension and no mass. There is no tenderness. There is no rebound and no guarding.  Musculoskeletal: She exhibits no edema or tenderness.  Lymphadenopathy:    She has no cervical adenopathy.  Neurological: She displays normal reflexes. No cranial nerve deficit. She exhibits normal muscle tone. Coordination normal.  Skin: No rash noted. No erythema.  Psychiatric: Her mood appears anxious. Her speech is rapid and/or pressured and tangential. She is not agitated, not aggressive, not hyperactive and not combative. Thought content is paranoid and delusional. Cognition and memory are not impaired. She does not express impulsivity or inappropriate judgment. She expresses no homicidal and no suicidal ideation. She expresses no suicidal plans and no homicidal plans. She exhibits normal recent memory and normal remote memory. She is inattentive.   Records, labs, X rays from [facility] (1/16) reviewed - extensive >45 min      Assessment & Plan:

## 2014-11-03 NOTE — Assessment & Plan Note (Signed)
Acyclovir po
